# Patient Record
Sex: Male | Born: 1965 | Race: White | Hispanic: No | Marital: Single | Smoking: Current every day smoker
Health system: Southern US, Community
[De-identification: ages and names within clinical notes are randomized; demographics above are authoritative.]

## PROBLEM LIST (undated history)

## (undated) DIAGNOSIS — F410 Panic disorder [episodic paroxysmal anxiety] without agoraphobia: Secondary | ICD-10-CM

## (undated) DIAGNOSIS — F419 Anxiety disorder, unspecified: Secondary | ICD-10-CM

## (undated) DIAGNOSIS — F4310 Post-traumatic stress disorder, unspecified: Secondary | ICD-10-CM

## (undated) DIAGNOSIS — F32A Depression, unspecified: Secondary | ICD-10-CM

## (undated) DIAGNOSIS — F4481 Dissociative identity disorder: Secondary | ICD-10-CM

## (undated) DIAGNOSIS — F3181 Bipolar II disorder: Secondary | ICD-10-CM

## (undated) DIAGNOSIS — E559 Vitamin D deficiency, unspecified: Secondary | ICD-10-CM

## (undated) DIAGNOSIS — E785 Hyperlipidemia, unspecified: Secondary | ICD-10-CM

## (undated) DIAGNOSIS — F329 Major depressive disorder, single episode, unspecified: Secondary | ICD-10-CM

---

## 2002-09-18 ENCOUNTER — Inpatient Hospital Stay (HOSPITAL_COMMUNITY): Admission: EM | Admit: 2002-09-18 | Discharge: 2002-09-21 | Payer: Self-pay | Admitting: Psychiatry

## 2018-11-28 ENCOUNTER — Emergency Department (HOSPITAL_COMMUNITY): Payer: Medicare Other

## 2018-11-28 ENCOUNTER — Other Ambulatory Visit: Payer: Self-pay

## 2018-11-28 ENCOUNTER — Encounter (HOSPITAL_COMMUNITY): Payer: Self-pay

## 2018-11-28 ENCOUNTER — Emergency Department (HOSPITAL_COMMUNITY)
Admission: EM | Admit: 2018-11-28 | Discharge: 2018-11-29 | Disposition: A | Discharge status: Home / Self Care | Payer: Medicare Other | Attending: Emergency Medicine | Admitting: Emergency Medicine

## 2018-11-28 DIAGNOSIS — R0602 Shortness of breath: Secondary | ICD-10-CM | POA: Diagnosis not present

## 2018-11-28 DIAGNOSIS — R0781 Pleurodynia: Secondary | ICD-10-CM

## 2018-11-28 DIAGNOSIS — R438 Other disturbances of smell and taste: Secondary | ICD-10-CM | POA: Diagnosis not present

## 2018-11-28 DIAGNOSIS — R61 Generalized hyperhidrosis: Secondary | ICD-10-CM | POA: Insufficient documentation

## 2018-11-28 DIAGNOSIS — F17200 Nicotine dependence, unspecified, uncomplicated: Secondary | ICD-10-CM | POA: Insufficient documentation

## 2018-11-28 DIAGNOSIS — R0789 Other chest pain: Secondary | ICD-10-CM | POA: Diagnosis present

## 2018-11-28 HISTORY — DX: Depression, unspecified: F32.A

## 2018-11-28 HISTORY — DX: Panic disorder (episodic paroxysmal anxiety): F41.0

## 2018-11-28 HISTORY — DX: Post-traumatic stress disorder, unspecified: F43.10

## 2018-11-28 HISTORY — DX: Bipolar II disorder: F31.81

## 2018-11-28 HISTORY — DX: Anxiety disorder, unspecified: F41.9

## 2018-11-28 HISTORY — DX: Dissociative identity disorder: F44.81

## 2018-11-28 HISTORY — DX: Hyperlipidemia, unspecified: E78.5

## 2018-11-28 HISTORY — DX: Vitamin D deficiency, unspecified: E55.9

## 2018-11-28 HISTORY — DX: Major depressive disorder, single episode, unspecified: F32.9

## 2018-11-28 LAB — BASIC METABOLIC PANEL
Anion gap: 9 (ref 5–15)
BUN: 12 mg/dL (ref 6–20)
CO2: 23 mmol/L (ref 22–32)
Calcium: 9.4 mg/dL (ref 8.9–10.3)
Chloride: 106 mmol/L (ref 98–111)
Creatinine, Ser: 1.11 mg/dL (ref 0.61–1.24)
GFR calc Af Amer: >60 mL/min (ref >60)
GFR calc non Af Amer: >60 mL/min (ref >60)
Glucose, Bld: 146 mg/dL — ABNORMAL HIGH (ref 70–99)
Potassium: 4.1 mmol/L (ref 3.5–5.1)
Sodium: 138 mmol/L (ref 135–145)

## 2018-11-28 LAB — CBC
HCT: 47.6 % (ref 39.0–52.0)
Hemoglobin: 16.1 g/dL (ref 13.0–17.0)
MCH: 28.7 pg (ref 26.0–34.0)
MCHC: 33.8 g/dL (ref 30.0–36.0)
MCV: 84.8 fL (ref 80.0–100.0)
Platelets: 194 10*3/uL (ref 150–400)
RBC: 5.61 MIL/uL (ref 4.22–5.81)
RDW: 12.8 % (ref 11.5–15.5)
WBC: 10.5 10*3/uL (ref 4.0–10.5)
nRBC: 0 % (ref 0.0–0.2)

## 2018-11-28 LAB — TROPONIN I (HIGH SENSITIVITY)
Troponin I (High Sensitivity): 3 ng/L (ref <18)
Troponin I (High Sensitivity): 4 ng/L (ref <18)

## 2018-11-28 MED ORDER — SODIUM CHLORIDE 0.9% FLUSH: 3.0000 mL | Freq: Once | INTRAVENOUS | Status: DC

## 2018-11-28 MED ORDER — IOHEXOL 350 MG/ML SOLN
80.0000 mL | Freq: Once | INTRAVENOUS | Status: AC | PRN
  Administered 2018-11-28: 80 mL via INTRAVENOUS

## 2018-11-28 NOTE — ED Notes (Signed)
Patient transported to CT 

## 2018-11-28 NOTE — ED Triage Notes (Signed)
Pt states he was seen earlier today at an [facility] in [place], pt had a COVID test, ekg and blood work done. Pt has been having chest pain for a few days, worse with deep inspiration and when he raises his arms. Pt was called by the doctor at the [facility] and was told to come here due to an elevated d dimer test. Pt denies any other symptoms. Pt a.o, nad noted.

## 2018-11-28 NOTE — ED Provider Notes (Addendum)
[facility] Provider Note   CSN: [id number] Arrival date & time: 11/28/18  2020    History   Chief Complaint Chief Complaint  Patient presents with  . Chest Pain    HPI Colton Cantrell is a 53 y.o. male with history of bipolar 2 disorder, DID , PTSD, anxiety, depression who presents with a 4-day history of right-sided chest pain.  It is pleuritic and worse with raising his arms.  He has had associated shortness of breath, worse on exertion.  He reports he has been waking up in sweats every night, but denies documented fever.  However, he has not been taking his temperature.  He also notes loss of taste.  Patient was seen at urgent care earlier today and was tested for COVID-19 as well as an EKG and a d-dimer.  He was called and told that he had elevated D-dimers and sent to come to the emergency department for rule out of pulmonary embolism.  Patient reports a trip to [place] over a month ago, but denies any other recent trips, surgeries, known cancer, new leg pain or swelling, history of blood clots.     HPI  Past Medical History:  Diagnosis Date  . Anxiety   . Bipolar 2 disorder (HCC)   . Depressive disorder   . Dissociative identity disorder (HCC)   . Hyperlipidemia   . Panic disorder   . PTSD (post-traumatic stress disorder)   . Vitamin D deficiency     There are no active problems to display for this patient.   History reviewed. No pertinent surgical history.      Home Medications    Prior to Admission medications   Not on File    Family History No family history on file.  Social History Social History   Tobacco Use  . Smoking status: Current Every Day Smoker  Substance Use Topics  . Alcohol use: Not on file  . Drug use: Not on file     Allergies   Oxycodone, Oxycodone-acetaminophen, and Strawberry (diagnostic)   Review of Systems Review of Systems  Constitutional: Positive for diaphoresis and fatigue.  Negative for chills and fever.  HENT: Negative for facial swelling and sore throat.   Respiratory: Positive for shortness of breath. Negative for cough.   Cardiovascular: Positive for chest pain.  Gastrointestinal: Negative for abdominal pain, nausea and vomiting.  Genitourinary: Negative for dysuria.  Musculoskeletal: Negative for back pain.  Skin: Negative for rash and wound.  Neurological: Negative for headaches.  Psychiatric/Behavioral: The patient is not nervous/anxious.      Physical Exam Updated Vital Signs BP (!) 153/92 (BP Location: Left Arm)   Pulse 100   Temp 98.2 F (36.8 C) (Oral)   Resp 16   SpO2 98%   Physical Exam Vitals signs and nursing note reviewed.  Constitutional:      General: He is not in acute distress.    Appearance: He is well-developed. He is not diaphoretic.  HENT:     Head: Normocephalic and atraumatic.     Mouth/Throat:     Pharynx: No oropharyngeal exudate.  Eyes:     General: No scleral icterus.       Right eye: No discharge.        Left eye: No discharge.     Conjunctiva/sclera: Conjunctivae normal.     Pupils: Pupils are equal, round, and reactive to light.  Neck:     Musculoskeletal: Normal range of motion and neck supple.  Thyroid: No thyromegaly.  Cardiovascular:     Rate and Rhythm: Normal rate and regular rhythm.     Heart sounds: Normal heart sounds. No murmur. No friction rub. No gallop.   Pulmonary:     Effort: Pulmonary effort is normal. No respiratory distress.     Breath sounds: Normal breath sounds. No stridor. No wheezing or rales.  Abdominal:     General: Bowel sounds are normal. There is no distension.     Palpations: Abdomen is soft.     Tenderness: There is no abdominal tenderness. There is no guarding or rebound.  Lymphadenopathy:     Cervical: No cervical adenopathy.  Skin:    General: Skin is warm and dry.     Coloration: Skin is not pale.     Findings: No rash.  Neurological:     Mental Status: He is  alert.     Coordination: Coordination normal.      ED Treatments / Results  Labs (all labs ordered are listed, but only abnormal results are displayed) Labs Reviewed  BASIC METABOLIC PANEL - Abnormal; Notable for the following components:      Result Value   Glucose, Bld 146 (*)    All other components within normal limits  CBC  TROPONIN I (HIGH SENSITIVITY)  TROPONIN I (HIGH SENSITIVITY)    EKG None  Radiology Dg Chest 2 View  Result Date: 11/28/2018 CLINICAL DATA:  Pt c/o right-sided chest pain, SOB, and night sweats x 2-3 days. Patient had an abnormal EKG and positive D-dimer at [facility] today; pt was called and told to go to the nearest emergency department. No hx of heart or.*comment was truncated*chest pain EXAM: CHEST - 2 VIEW COMPARISON:  None. FINDINGS: Normal mediastinum and cardiac silhouette. Normal pulmonary vasculature. No evidence of effusion, infiltrate, or pneumothorax. No acute bony abnormality. IMPRESSION: No acute cardiopulmonary process. Electronically Signed   By: Suzy Bouchard M.D.   On: 11/28/2018 20:46    Procedures Procedures (including critical care time)  Medications Ordered in ED Medications  sodium chloride flush (NS) 0.9 % injection 3 mL (has no administration in time range)     Initial Impression / Assessment and Plan / ED Course  I have reviewed the triage vital signs and the nursing notes.  Pertinent labs & imaging results that were available during my care of the patient were reviewed by me and considered in my medical decision making (see chart for details).        Patient presenting with a 4-day history of pleuritic chest pain and shortness of breath.  He was tested for COVID-19 and a d-dimer earlier, which is elevated.  COVID-19 test is pending.  Considering elevated d-dimer and pleuritic chest pain, as well as [id number] on EKG, CT angio of the chest is pending to rule out pulmonary embolism.  However considering loss of  taste and reported sweats at night, favor COVID-19 infection.  If CT angios negative, plan to discharge home with isolation instructions and symptomatic treatment.  At shift change, patient care transferred to [facility], PA-C for follow-up of the CT angio of the chest.  Colton Cantrell was evaluated in Emergency Department on 11/28/2018 for the symptoms described in the history of present illness. He was evaluated in the context of the global COVID-19 pandemic, which necessitated consideration that the patient might be at risk for infection with the SARS-CoV-2 virus that causes COVID-19. Institutional protocols and algorithms that pertain to the evaluation of patients at  risk for COVID-19 are in a state of rapid change based on information released by regulatory bodies including the CDC and federal and state organizations. These policies and algorithms were followed during the patient's care in the ED.   Final Clinical Impressions(s) / ED Diagnoses   Final diagnoses:  Pleuritic chest pain    ED Discharge Orders    None          Emi HolesLaw, Jaspreet Hollings M, PA-C 11/28/18 2314    Linwood DibblesKnapp, Jon, MD 11/29/18 [phone]

## 2018-11-29 NOTE — ED Provider Notes (Signed)
12:56 AM CTA negative for PE. Plan for discharge and outpatient PCP follow up. VSS. Patient discharged in satisfactory condition.  Vitals:   11/28/18 2300 11/28/18 2315 11/28/18 2330 11/29/18 0000  BP: (!) 148/86 (!) 126/113 (!) 104/58 (!) 141/81  Pulse: 65 63 72 73  Resp: (!) 23 (!) 25 18 (!) 23  Temp:      TempSrc:      SpO2: 94% 95% 96% 96%   Results for orders placed or performed during the hospital encounter of 11/28/18  Basic metabolic panel  Result Value Ref Range   Sodium 138 135 - 145 mmol/L   Potassium 4.1 3.5 - 5.1 mmol/L   Chloride 106 98 - 111 mmol/L   CO2 23 22 - 32 mmol/L   Glucose, Bld 146 (H) 70 - 99 mg/dL   BUN 12 6 - 20 mg/dL   Creatinine, Ser 1.611.11 0.61 - 1.24 mg/dL   Calcium 9.4 8.9 - 09.610.3 mg/dL   GFR calc non Af Amer >60 >60 mL/min   GFR calc Af Amer >60 >60 mL/min   Anion gap 9 5 - 15  CBC  Result Value Ref Range   WBC 10.5 4.0 - 10.5 K/uL   RBC 5.61 4.22 - 5.81 MIL/uL   Hemoglobin 16.1 13.0 - 17.0 g/dL   HCT 04.547.6 40.939.0 - 81.152.0 %   MCV 84.8 80.0 - 100.0 fL   MCH 28.7 26.0 - 34.0 pg   MCHC 33.8 30.0 - 36.0 g/dL   RDW 91.412.8 78.211.5 - 95.615.5 %   Platelets 194 150 - 400 K/uL   nRBC 0.0 0.0 - 0.2 %  Troponin I (High Sensitivity)  Result Value Ref Range   Troponin I (High Sensitivity) 3 <18 ng/L  Troponin I (High Sensitivity)  Result Value Ref Range   Troponin I (High Sensitivity) 4 <18 ng/L   Dg Chest 2 View  Result Date: 11/28/2018 CLINICAL DATA:  Pt c/o right-sided chest pain, SOB, and night sweats x 2-3 days. Patient had an abnormal EKG and positive D-dimer at [facility] today; pt was called and told to go to the nearest emergency department. No hx of heart or.*comment was truncated*chest pain EXAM: CHEST - 2 VIEW COMPARISON:  None. FINDINGS: Normal mediastinum and cardiac silhouette. Normal pulmonary vasculature. No evidence of effusion, infiltrate, or pneumothorax. No acute bony abnormality. IMPRESSION: No acute cardiopulmonary process.  Electronically Signed   By: Genevive BiStewart  Edmunds M.D.   On: 11/28/2018 20:46   Ct Angio Chest Pe W And/or Wo Contrast  Result Date: 11/29/2018 CLINICAL DATA:  PE suspected, 4 days of right-sided pleuritic chest pain, positive D-dimer. EXAM: CT ANGIOGRAPHY CHEST WITH CONTRAST TECHNIQUE: Multidetector CT imaging of the chest was performed using the standard protocol during bolus administration of intravenous contrast. Multiplanar CT image reconstructions and MIPs were obtained to evaluate the vascular anatomy. CONTRAST:  80mL OMNIPAQUE IOHEXOL 350 MG/ML SOLN COMPARISON:  Chest radiograph November 28, 2018 FINDINGS: Cardiovascular: Satisfactory opacification of the pulmonary arteries to the segmental level. No evidence of pulmonary embolism. Normal aortic caliber. Normal 3 vessel arch. Normal heart size. Few coronary calcifications. No pericardial effusion. Mediastinum/Nodes: No enlarged mediastinal, hilar, or axillary lymph nodes. Thyroid gland, trachea, and esophagus demonstrate no significant findings. Lungs/Pleura: Vasotec few bandlike areas of opacity likely reflecting atelectasis and/or scarring. Mosaic attenuation of the lungs likely attributable to imaging during exhalation. No consolidation, pneumothorax or pleural effusion. Upper Abdomen: No acute abnormality in the upper abdomen. Nonobstructive calculus in the upper pole right  kidney. Bilateral fluid attenuation renal sinus cysts. Musculoskeletal: Surgical anchors in the right humerus. Multilevel degenerative changes throughout the thoracic spine including vacuum disc and disc mineralization at T10-11 similar to comparison CT from 2012. No acute osseous abnormality or suspicious osseous lesions. Review of the MIP images confirms the above findings. IMPRESSION: No evidence of pulmonary embolism. No acute intrathoracic process. Aortic Atherosclerosis (ICD10-I70.0). Coronary atherosclerosis. Electronically Signed   By: Lovena Le M.D.   On: 11/29/2018 00:07       Antonietta Breach, PA-C 11/29/18 0057    Fatima Blank, MD 11/29/18 [phone]

## 2018-11-29 NOTE — Discharge Instructions (Signed)
Your work-up in the emergency department was reassuring.  You did not have a blood clot in your lung on your chest CT.  Continue the instructions given to you by urgent care.  It is possible that you may have coronavirus.  Continue to quarantine away from other individuals for at least 2 weeks, follow-up on the results of your pending coronavirus test.  You may return to the ED for new or concerning symptoms.

## 2020-10-15 IMAGING — CT CT ANGIOGRAPHY CHEST
2 of 8 series · 18 of 46 positions shown · IV contrast (omnipaque)
Comparison: Chest radiograph November 28, 2018

CLINICAL DATA: PE suspected, 4 days of right-sided pleuritic chest
pain, positive D-dimer.

EXAM:
CT ANGIOGRAPHY CHEST WITH CONTRAST
TECHNIQUE: Multidetector CT imaging of the chest was performed using the
standard protocol during bolus administration of intravenous
contrast. Multiplanar CT image reconstructions and MIPs were
obtained to evaluate the vascular anatomy.
CONTRAST:  80mL OMNIPAQUE IOHEXOL 350 MG/ML SOLN

[Series 7: thins · axial · 0.87mm/px · z∈[+1052,+1356]mm · 15 of 336 slices shown]
[im 16/336  lung]
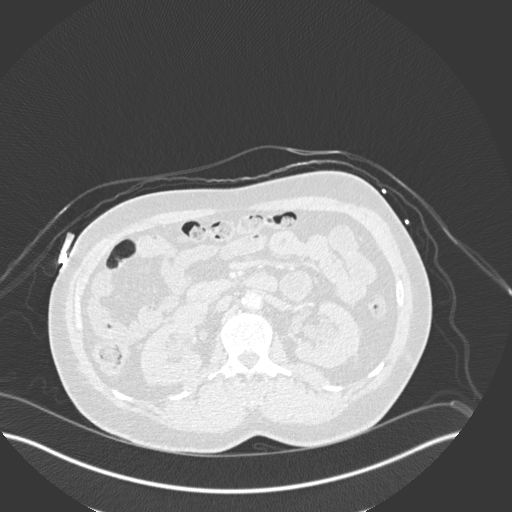
[im 46/336  soft-tissue]
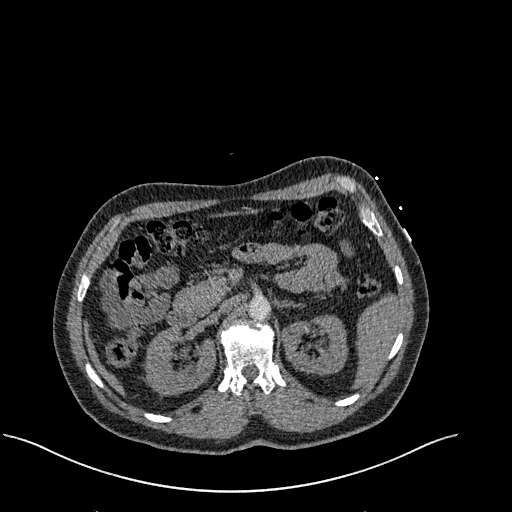
[im 61/336  lung]
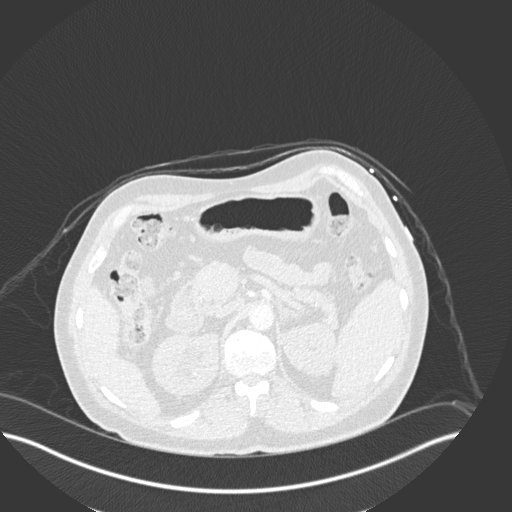
[im 77/336  soft-tissue]
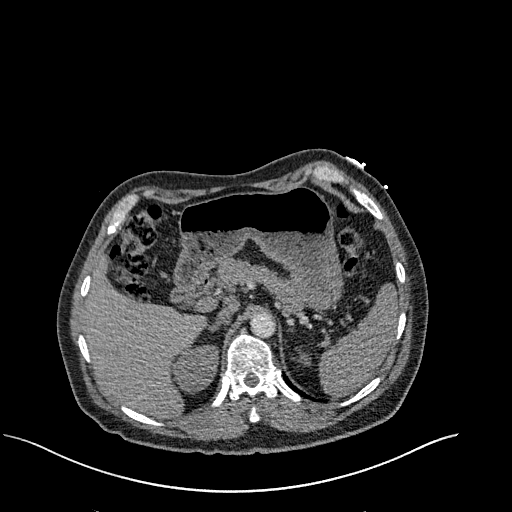
[im 107/336  lung]
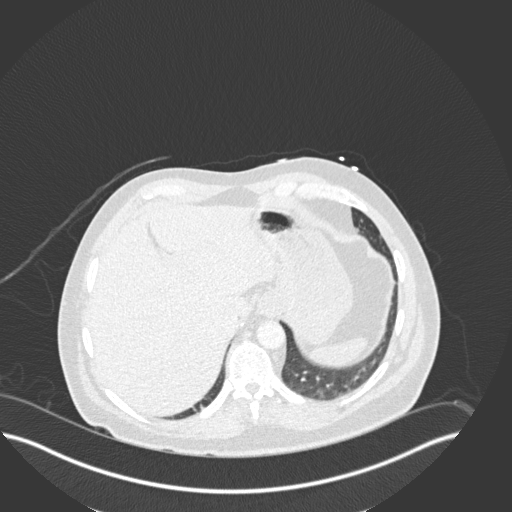
[im 122/336  soft-tissue]
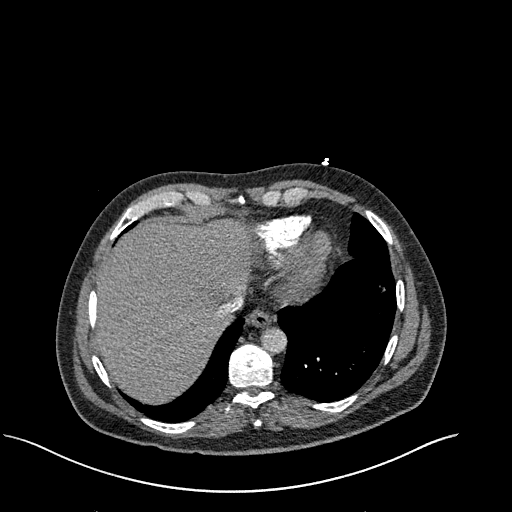
[im 153/336  lung]
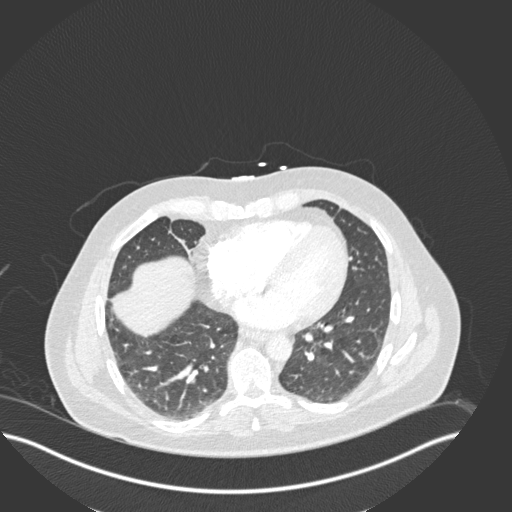
[im 168/336  soft-tissue]
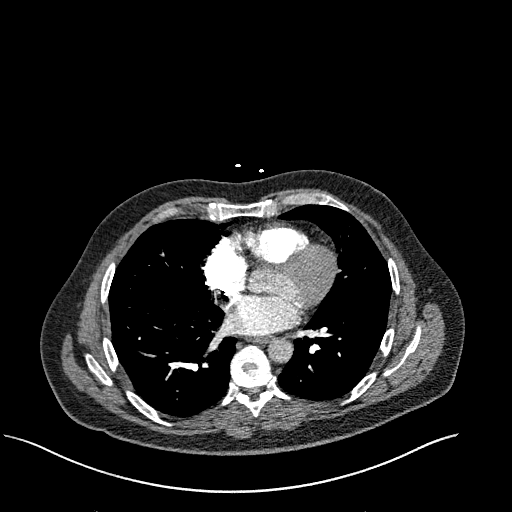
[im 183/336  lung]
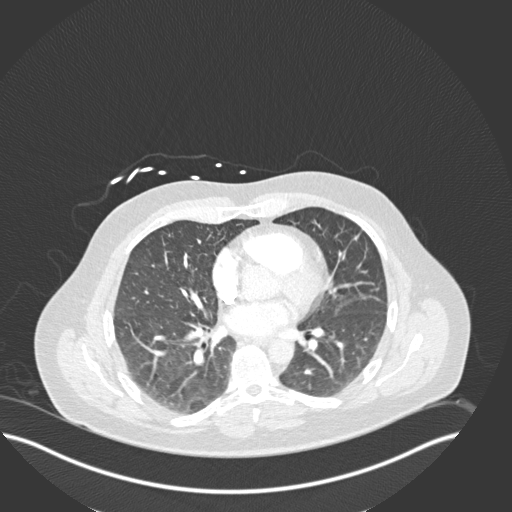
[im 214/336  soft-tissue]
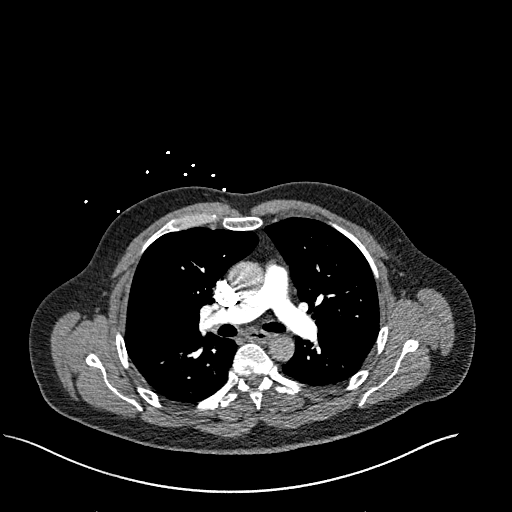
[im 229/336  lung]
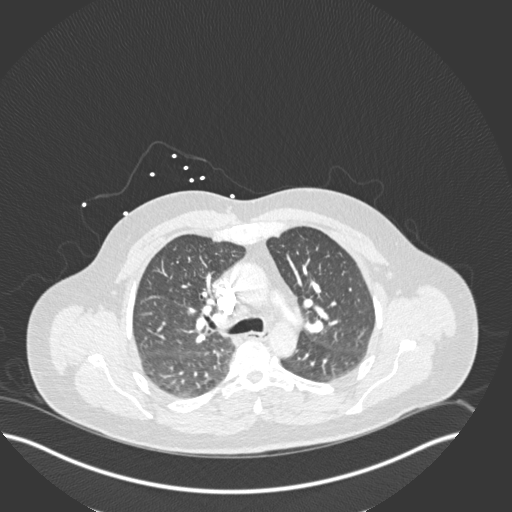
[im 259/336  soft-tissue]
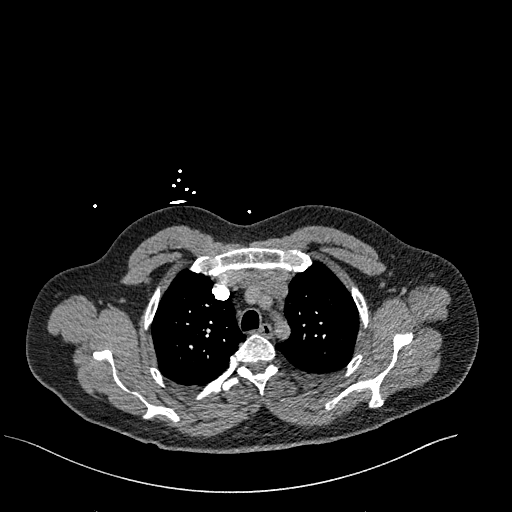
[im 275/336  lung]
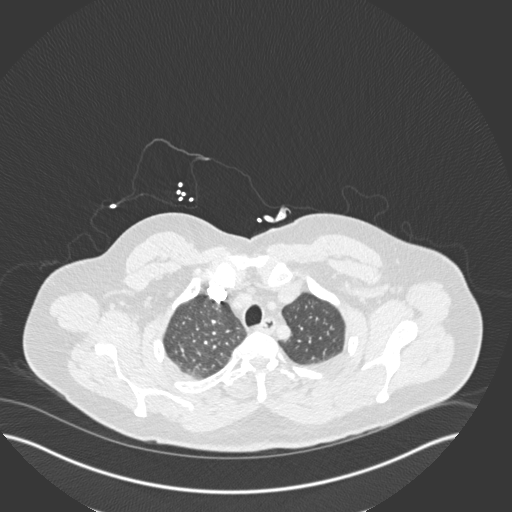
[im 290/336  soft-tissue]
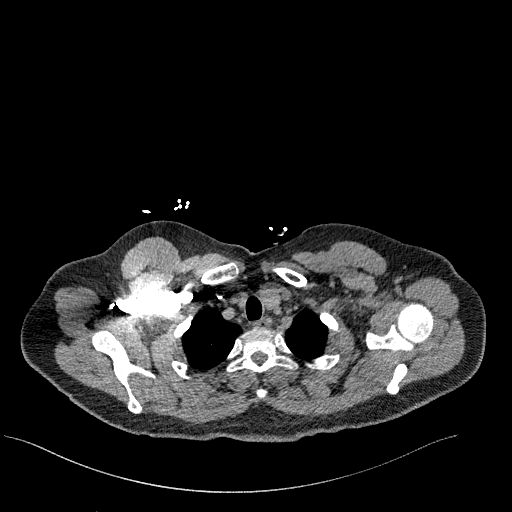
[im 320/336  lung]
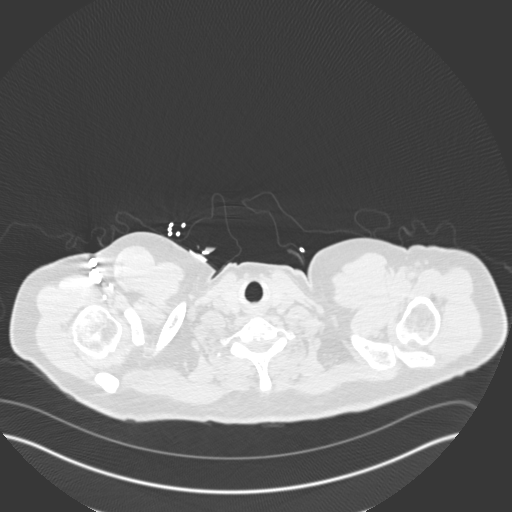

[Series 8: coronal mpr · coronal · 0.66mm/px · 3 of 134 slices shown]
[im 34/134  soft-tissue]
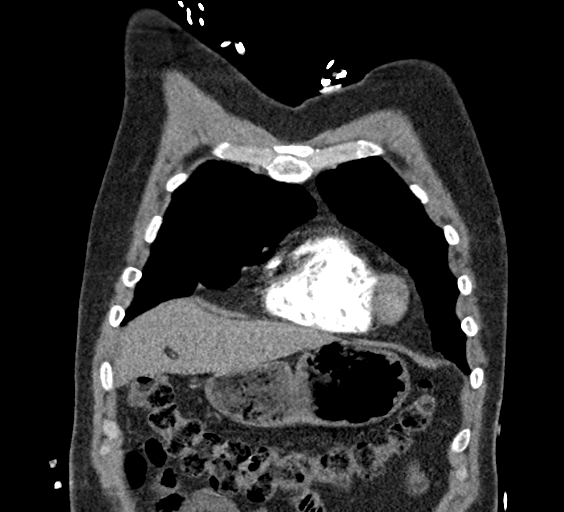
[im 67/134  soft-tissue]
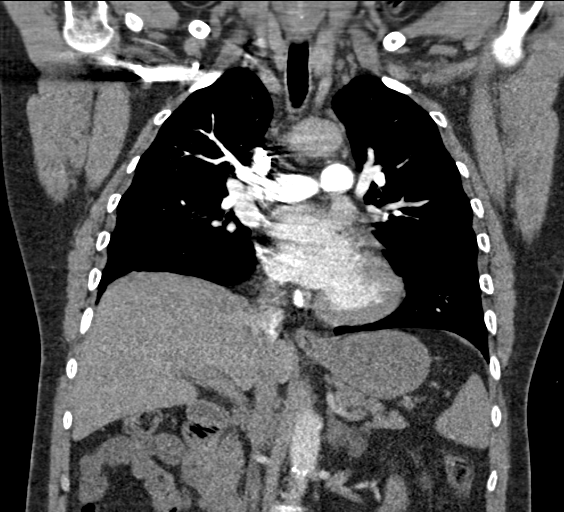
[im 100/134  soft-tissue]
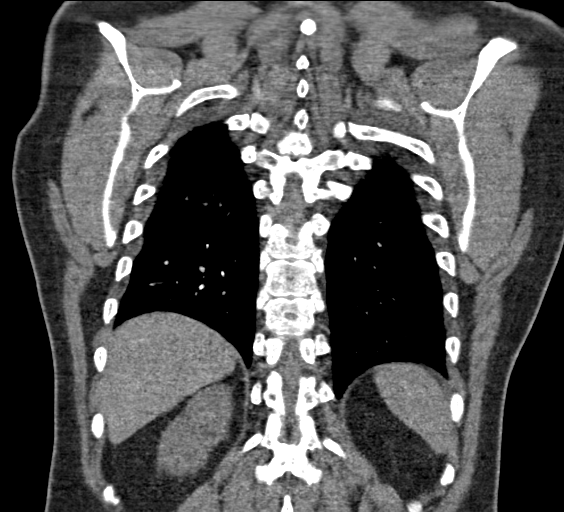

[18 of 46 positions shown; findings below may reference images not displayed]

FINDINGS: Cardiovascular: Satisfactory opacification of the pulmonary arteries
to the segmental level. No evidence of pulmonary embolism. Normal
aortic caliber. Normal 3 vessel arch. Normal heart size. Few
coronary calcifications. No pericardial effusion.

Mediastinum/Nodes: No enlarged mediastinal, hilar, or axillary lymph
nodes. Thyroid gland, trachea, and esophagus demonstrate no
significant findings.

Lungs/Pleura: Vasotec few bandlike areas of opacity likely
reflecting atelectasis and/or scarring. Mosaic attenuation of the
lungs likely attributable to imaging during exhalation. No
consolidation, pneumothorax or pleural effusion.

Upper Abdomen: No acute abnormality in the upper abdomen.
Nonobstructive calculus in the upper pole right kidney. Bilateral
fluid attenuation renal sinus cysts.

Musculoskeletal: Surgical anchors in the right humerus. Multilevel
degenerative changes throughout the thoracic spine including vacuum
disc and disc mineralization at T10-11 similar to comparison CT from
3023. No acute osseous abnormality or suspicious osseous lesions.

Review of the MIP images confirms the above findings.
IMPRESSION: No evidence of pulmonary embolism. No acute intrathoracic process.

Aortic Atherosclerosis ([id number]).

Coronary atherosclerosis.
